# Patient Record
Sex: Female | Born: 1970 | Hispanic: Yes | Marital: Single | Smoking: Never smoker
Health system: Southern US, Community
[De-identification: ages and names within clinical notes are randomized; demographics above are authoritative.]

## PROBLEM LIST (undated history)

## (undated) DIAGNOSIS — I10 Essential (primary) hypertension: Secondary | ICD-10-CM

## (undated) HISTORY — PX: ABDOMINAL HYSTERECTOMY: SHX81

---

## 2019-06-14 ENCOUNTER — Telehealth: Payer: Self-pay | Admitting: Pediatric Intensive Care

## 2019-06-14 NOTE — Telephone Encounter (Signed)
Interpretation via [place]- Client states recent history of feeling flush, especially when she is anxious. Client states history of hypertension but has never been diagnosed or treated with medication. Client has been working toward [employer] but has not completed as of yet. She would like to be connected to primary care. Lisette Abu RN BSN CNP [phone]

## 2019-06-15 ENCOUNTER — Telehealth: Payer: Self-pay

## 2019-06-15 NOTE — Telephone Encounter (Signed)
Call received from [facility], RN/CN requesting an appointment for patient to establish care at [facility]. Informed her that an appointment has been scheduled for 06/23/2019 @ 1530 @ [place]. The patient will be called to confirm if this will be in person or televisit /WebEx.  Message sent to [facility] front office notifying them of the appointment.  As per [place] [facility] [employer] is working with the patient and will notify her of the appointment

## 2019-06-23 ENCOUNTER — Ambulatory Visit: Payer: Self-pay | Admitting: Primary Care

## 2021-09-04 ENCOUNTER — Encounter (HOSPITAL_COMMUNITY): Payer: Self-pay

## 2021-09-04 ENCOUNTER — Other Ambulatory Visit: Payer: Self-pay

## 2021-09-04 ENCOUNTER — Ambulatory Visit (HOSPITAL_COMMUNITY)
Admission: EM | Admit: 2021-09-04 | Discharge: 2021-09-04 | Disposition: A | Discharge status: Home / Self Care | Payer: Self-pay | Attending: Emergency Medicine | Admitting: Emergency Medicine

## 2021-09-04 DIAGNOSIS — Z20822 Contact with and (suspected) exposure to covid-19: Secondary | ICD-10-CM | POA: Insufficient documentation

## 2021-09-04 DIAGNOSIS — R300 Dysuria: Secondary | ICD-10-CM

## 2021-09-04 DIAGNOSIS — B349 Viral infection, unspecified: Secondary | ICD-10-CM

## 2021-09-04 LAB — POCT URINALYSIS DIPSTICK, ED / UC
Bilirubin Urine: NEGATIVE
Glucose, UA: NEGATIVE mg/dL
Ketones, ur: NEGATIVE mg/dL
Nitrite: NEGATIVE
Protein, ur: NEGATIVE mg/dL
Specific Gravity, Urine: 1.025 (ref 1.005–1.030)
Urobilinogen, UA: 1 mg/dL (ref 0.0–1.0)
pH: 7 (ref 5.0–8.0)

## 2021-09-04 NOTE — Discharge Instructions (Addendum)
Quarantine until you have COVID results/per CDC guidelines. Please get established with PCP of your choice for regular check ups, annual physical,etc.   Your urine is being sent for culture. No abx indicated at present. Increase fluid intake, may alternate tylenol/ibuprofen as label directed.

## 2021-09-04 NOTE — ED Triage Notes (Signed)
Pt presents with headache, non productive cough, sore throat, and chills X 5 days.

## 2021-09-04 NOTE — ED Provider Notes (Signed)
[facility]    CSN: [id number] Arrival date & time: 09/04/21  1433      History   Chief Complaint Chief Complaint  Patient presents with   URI   Headache     HPI Patricia Mccann is a 50 y.o. female.   50 year old female pt, Patricia Mccann, presents to UC with chief complaint of HA,nonproductive cough, sore throat, chills x 5 days , also c/o dysuria, per interpreter # [phone]. Advised via interpreter would perform UA and Covid test. Pt states she has only tried claritin and rest for symptoms. No known illness exposure, works at [employer]  The history is provided by the patient. No language interpreter was used.   History reviewed. No pertinent past medical history.  Patient Active Problem List   Diagnosis Date Noted   Nonspecific syndrome suggestive of viral illness 09/04/2021   Dysuria 09/04/2021     History reviewed. No pertinent surgical history.  OB History   No obstetric history on file.      Home Medications    Prior to Admission medications   Not on File    Family History Family History  Family history unknown: Yes    Social History Social History   Tobacco Use   Smoking status: Never   Smokeless tobacco: Never     Allergies   Patient has no known allergies.   Review of Systems Review of Systems  Constitutional:  Positive for chills.  HENT:  Positive for congestion and sore throat.   Respiratory:  Positive for cough.   Genitourinary:  Positive for dysuria.  Neurological:  Positive for headaches.  All other systems reviewed and are negative.   Physical Exam Triage Vital Signs ED Triage Vitals  Enc Vitals Group     BP      Pulse      Resp      Temp      Temp src      SpO2      Weight      Height      Head Circumference      Peak Flow      Pain Score      Pain Loc      Pain Edu?      Excl. in GC?    No data found.  Updated Vital Signs BP 120/75 (BP Location: Left Arm)   Pulse 69   Temp 98.5  F (36.9 C) (Oral)   Resp 18   SpO2 97%   Visual Acuity Right Eye Distance:   Left Eye Distance:   Bilateral Distance:    Right Eye Near:   Left Eye Near:    Bilateral Near:     Physical Exam Vitals and nursing note reviewed.  Constitutional:      General: She is not in acute distress.    Appearance: She is well-developed.  HENT:     Head: Normocephalic.     Right Ear: Tympanic membrane is retracted.     Left Ear: Tympanic membrane is retracted.     Nose: Congestion present.     Mouth/Throat:     Lips: Pink.     Mouth: Mucous membranes are moist.     Pharynx: Oropharynx is clear.  Eyes:     General: Lids are normal.     Conjunctiva/sclera: Conjunctivae normal.     Pupils: Pupils are equal, round, and reactive to light.  Neck:     Trachea: No tracheal deviation.  Cardiovascular:     Rate and Rhythm: Regular rhythm.     Pulses: Normal pulses.     Heart sounds: Normal heart sounds. No murmur heard. Pulmonary:     Effort: Pulmonary effort is normal.     Breath sounds: Normal breath sounds.  Abdominal:     General: Bowel sounds are normal.     Palpations: Abdomen is soft.     Tenderness: There is no abdominal tenderness.  Musculoskeletal:        General: Normal range of motion.     Cervical back: Normal range of motion.  Lymphadenopathy:     Cervical: No cervical adenopathy.  Skin:    General: Skin is warm and dry.     Findings: No rash.  Neurological:     General: No focal deficit present.     Mental Status: She is alert and oriented to person, place, and time.     GCS: GCS eye subscore is 4. GCS verbal subscore is 5. GCS motor subscore is 6.  Psychiatric:        Attention and Perception: Attention normal.        Mood and Affect: Mood normal.        Speech: Speech normal.        Behavior: Behavior normal. Behavior is cooperative.     UC Treatments / Results  Labs (all labs ordered are listed, but only abnormal results are displayed) Labs Reviewed  POCT  URINALYSIS DIPSTICK, ED / UC - Abnormal; Notable for the following components:      Result Value   Hgb urine dipstick TRACE (*)    Leukocytes,Ua SMALL (*)    All other components within normal limits  SARS CORONAVIRUS 2 (TAT 6-24 HRS)  URINE CULTURE    EKG   Radiology No results found.  Procedures Procedures (including critical care time)  Medications Ordered in UC Medications - No data to display  Initial Impression / Assessment and Plan / UC Course  I have reviewed the triage vital signs and the nursing notes.  Pertinent labs & imaging results that were available during my care of the patient were reviewed by me and considered in my medical decision making (see chart for details).   Ddx: COVID, URI, UTI, allergies Final Clinical Impressions(s) / UC Diagnoses   Final diagnoses:  Nonspecific syndrome suggestive of viral illness  Dysuria     Discharge Instructions      Quarantine until you have COVID results/per CDC guidelines. Please get established with PCP of your choice for regular check ups, annual physical,etc.   Your urine is being sent for culture. No abx indicated at present. Increase fluid intake, may alternate tylenol/ibuprofen as label directed.      ED Prescriptions   None    PDMP not reviewed this encounter.   Clancy Gourd, NP 09/04/21 1902

## 2021-09-05 LAB — URINE CULTURE
Culture: NO GROWTH
Special Requests: NORMAL

## 2021-09-05 LAB — SARS CORONAVIRUS 2 (TAT 6-24 HRS): SARS Coronavirus 2: NEGATIVE

## 2021-10-10 ENCOUNTER — Other Ambulatory Visit: Payer: Self-pay

## 2021-10-10 ENCOUNTER — Ambulatory Visit (HOSPITAL_COMMUNITY)
Admission: EM | Admit: 2021-10-10 | Discharge: 2021-10-10 | Disposition: A | Discharge status: Home / Self Care | Payer: Self-pay | Attending: Emergency Medicine | Admitting: Emergency Medicine

## 2021-10-10 ENCOUNTER — Encounter (HOSPITAL_COMMUNITY): Payer: Self-pay | Admitting: *Deleted

## 2021-10-10 ENCOUNTER — Ambulatory Visit (INDEPENDENT_AMBULATORY_CARE_PROVIDER_SITE_OTHER): Payer: Self-pay

## 2021-10-10 DIAGNOSIS — R0789 Other chest pain: Secondary | ICD-10-CM

## 2021-10-10 DIAGNOSIS — R079 Chest pain, unspecified: Secondary | ICD-10-CM

## 2021-10-10 MED ORDER — NAPROXEN 500 MG PO TABS: 500.0000 mg | ORAL_TABLET | Freq: Two times a day (BID) | ORAL | 0 refills | Status: DC

## 2021-10-10 NOTE — Discharge Instructions (Addendum)
Your EKG and chest x-ray were normal.  I suspect that this is musculoskeletal chest pain.  I am giving you Naprosyn to take twice a day.  Take it with 1000 mg of Tylenol.  This should help with your pain.  Below is a list of primary care practices who are taking new patients for you to follow-up with.  [facility] internal medicine clinic Ground Floor - [facility], [address] [place], [place], [place] [address] [phone]  [facility] Care at [facility] [address] [place], [place] [address] [phone]  [facility] [address] [place], [place] [address] [phone]  [facility] [phone] [address] [place] [address]  [facility]: [address] [place] [address]  [phone]  [facility]: [address] [place] [address]  [phone]  [facility] : [address] [place] [address] [phone]  [facility] Primary Care: [address] [place] [address] [phone]  [facility]: [address] [place] [address] [phone]  Dr. Oneal Grout [address] [facility] [place] [address]  [phone]  Go to www.goodrx.com  or www.costplusdrugs.com to look up your medications. This will give you a list of where you can find your prescriptions at the most affordable prices. Or ask the pharmacist what the cash price is, or if they have any other discount programs available to help make your medication more affordable. This can be less expensive than what you would pay with insurance.

## 2021-10-10 NOTE — ED Triage Notes (Signed)
Pt reports Lt sided CP today. Blurred vision started one week ago. Pt also reports a nose bleed one week ago.

## 2021-10-10 NOTE — ED Provider Notes (Signed)
HPI  SUBJECTIVE:  Patricia Mccann is a 50 y.o. female who presents with 5 days of seconds long stabbing left-sided chest pain.  She states that she is having 2-3 episodes per day.  She reports occasional diaphoresis and palpitations with this chest pain.  No nausea, shortness of breath, trauma to the chest, coughing, wheezing.  She works 12-hour shifts at [employer] and does a lot of heavy lifting with her arms.  No other change in physical activity.  There is no exertional or positional component to it.  It is worse with deep inspiration.  She has not tried anything for this.  No alleviating factors.  No calf pain, swelling, hemoptysis, surgery in the past 4 weeks, exogenous estrogen, recent immobilization.  She has had identical chest pain like this before when she lost her daughter.  She got an unknown injection from her PMD.  She has never had a cardiac or ER evaluation for this chest pain.  Past medical history negative for diabetes, hypertension, hypercholesterolemia, MI, coronary disease, PVD/PAD, CVA/TIA, PE/DVT, cancer, smoking.  Family history significant for sister with MI at age 20.  PMD: None.   History reviewed. No pertinent past medical history.  History reviewed. No pertinent surgical history.  Family History  Family history unknown: Yes    Social History   Tobacco Use   Smoking status: Never   Smokeless tobacco: Never    No current facility-administered medications for this encounter.  Current Outpatient Medications:    naproxen (NAPROSYN) 500 MG tablet, Take 1 tablet (500 mg total) by mouth 2 (two) times daily., Disp: 20 tablet, Rfl: 0  No Known Allergies   ROS  As noted in HPI.   Physical Exam  BP (!) 143/106   Pulse 72   Temp 97.7 F (36.5 C) (Oral)   Resp 20   SpO2 99%   Constitutional: Well developed, well nourished, no acute distress Eyes:  EOMI, conjunctiva normal bilaterally HENT: Normocephalic, atraumatic,mucus membranes moist Respiratory:  Normal inspiratory effort, lungs clear bilaterally. Cardiovascular: Normal rate, regular rhythm, no murmurs rubs or gallops.  Positive reproducible left-sided chest wall tenderness GI: nondistended skin: No rash, skin intact Musculoskeletal: Calves symmetric, nontender, no edema Neurologic: Alert & oriented x 3, no focal neuro deficits Psychiatric: Speech and behavior appropriate   ED Course   Medications - No data to display  Orders Placed This Encounter  Procedures   DG Chest 2 View    Standing Status:   Standing    Number of Occurrences:   1    Order Specific Question:   Reason for Exam (SYMPTOM  OR DIAGNOSIS REQUIRED)    Answer:   L sided CP   EKG 12-Lead    Standing Status:   Standing    Number of Occurrences:   1   ED EKG    Standing Status:   Standing    Number of Occurrences:   1    Order Specific Question:   Reason for Exam    Answer:   Chest Pain    Order Specific Question:   Release to patient    Answer:   Immediate    No results found for this or any previous visit (from the past 24 hour(s)). DG Chest 2 View  Result Date: 10/10/2021 CLINICAL DATA:  Left chest pain EXAM: CHEST - 2 VIEW COMPARISON:  None. FINDINGS: The heart size and mediastinal contours are within normal limits. Both lungs are clear. The visualized skeletal structures are unremarkable. IMPRESSION:  No active cardiopulmonary disease. Electronically Signed   By: Helyn Numbers M.D.   On: 10/10/2021 21:09    ED Clinical Impression  1. Chest pain, musculoskeletal      ED Assessment/Plan  EKG: Normal sinus rhythm, rate 84.  Normal axis, normal vocals.  No hypertrophy.  No ST-T wave changes consistent with ischemia.  No previous EKG for comparison.  Patient was asymptomatic while EKG was obtained.  This seems very musculoskeletal as it is reproducible, she does a lot of heavy lifting at work.  Wells score for PE 0.  Her HEARt score is 3: 1 point for history one-point for age, one-point for family  history of MI before 40.  She is at low risk for major cardiac adverse event within the next 30 days.  Discussed with patient that we would be able to do an x-ray here, but not be able to complete a cardiac work-up.  Discussed with her that I be happy to transfer to the emergency department to make sure that this is not her heart, however, she has opted for chest x-ray here, trial of NSAIDs, and close outpatient follow-up.  Strict ER return precautions given.  Will provide primary care list and order assistance of finding a PMD.   Reviewed imaging independently.  Normal chest x-ray.  See radiology report for full details.  will try treating as a musculoskeletal chest pain for now.  Home with Naprosyn 500 mg p.o. twice daily for 5 days Using the language line, discussed EKG, imaging, MDM, treatment plan, and plan for follow-up with patient. Discussed sn/sx that should prompt return to the ED. patient agrees with plan.   Spent 50 minutes with patient obtaining H&P, explaining treatment plan, plan for follow-up and medical decision making.  Meds ordered this encounter  Medications   naproxen (NAPROSYN) 500 MG tablet    Sig: Take 1 tablet (500 mg total) by mouth 2 (two) times daily.    Dispense:  20 tablet    Refill:  0      *This clinic note was created using Scientist, clinical (histocompatibility and immunogenetics). Therefore, there may be occasional mistakes despite careful proofreading.  ?    Domenick Gong, MD 10/10/21 2131

## 2022-03-12 ENCOUNTER — Encounter: Payer: Self-pay | Admitting: Family Medicine

## 2022-03-12 ENCOUNTER — Ambulatory Visit: Payer: Self-pay | Attending: Family Medicine | Admitting: Family Medicine

## 2022-03-12 ENCOUNTER — Other Ambulatory Visit: Payer: Self-pay

## 2022-03-12 VITALS — BP 134/81 | HR 72 | Ht 60.0 in | Wt 120.8 lb

## 2022-03-12 DIAGNOSIS — Z13228 Encounter for screening for other metabolic disorders: Secondary | ICD-10-CM

## 2022-03-12 DIAGNOSIS — G8929 Other chronic pain: Secondary | ICD-10-CM

## 2022-03-12 DIAGNOSIS — M25561 Pain in right knee: Secondary | ICD-10-CM

## 2022-03-12 DIAGNOSIS — Z1159 Encounter for screening for other viral diseases: Secondary | ICD-10-CM

## 2022-03-12 DIAGNOSIS — R634 Abnormal weight loss: Secondary | ICD-10-CM

## 2022-03-12 DIAGNOSIS — R630 Anorexia: Secondary | ICD-10-CM

## 2022-03-12 MED ORDER — MIRTAZAPINE 15 MG PO TBDP: 15.0000 mg | ORAL_TABLET | Freq: Every day | ORAL | 1 refills | Status: DC

## 2022-03-12 MED ORDER — MELOXICAM 7.5 MG PO TABS: 7.5000 mg | ORAL_TABLET | Freq: Every day | ORAL | 1 refills | Status: DC

## 2022-03-12 NOTE — Progress Notes (Signed)
? ?Subjective:  ?Patient ID: Patricia Mccann, female    DOB: 10-Jan-1971  Age: 51 y.o. MRN: [id number] ? ?CC: New Patient (Initial Visit) ? ? ?HPI ?Patricia Mccann is a 51 y.o. year old female who presents today to establish care. ? ?Interval History: ?She would like to gain some weight as she currently weighs 120lbs and would like to weigh more. In the past she weighed 135 lbs and she underwent some form of trauma in the past after which she lost weight. When she takes vitamins she notices an improved appetite but without that she has a poor appetite. ?Denies being depressed and denies presence of insomnia. ? ?She has not had periods since the age of 41; she states she had a c-section but is not sure what kind of surgery she had. ? ?Complains of right knee pain in the posterior medial aspect of her knee and does feel a bulge in that aspect when she has been standing for prolonged periods of time ?No past medical history on file. ? ?Past Surgical History:  ?Procedure Laterality Date  ? CESAREAN SECTION    ? ? ?Family History  ?Family history unknown: Yes  ? ? ?Social History  ? ?Socioeconomic History  ? Marital status: Single  ?  Spouse name: Not on file  ? Number of children: Not on file  ? Years of education: Not on file  ? Highest education level: Not on file  ?Occupational History  ? Not on file  ?Tobacco Use  ? Smoking status: Never  ? Smokeless tobacco: Never  ?Substance and Sexual Activity  ? Alcohol use: Never  ? Drug use: Never  ? Sexual activity: Yes  ?Other Topics Concern  ? Not on file  ?Social History Narrative  ? Not on file  ? ?Social Determinants of Health  ? ?Financial Resource Strain: Not on file  ?Food Insecurity: Not on file  ?Transportation Needs: Not on file  ?Physical Activity: Not on file  ?Stress: Not on file  ?Social Connections: Not on file  ? ? ?No Known Allergies ? ?Outpatient Medications Prior to Visit  ?Medication Sig Dispense Refill  ? naproxen (NAPROSYN) 500 MG tablet Take 1  tablet (500 mg total) by mouth 2 (two) times daily. 20 tablet 0  ? ?No facility-administered medications prior to visit.  ? ? ? ?ROS ?Review of Systems  ?Constitutional:  Negative for activity change, appetite change and fatigue.  ?HENT:  Negative for congestion, sinus pressure and sore throat.   ?Eyes:  Negative for visual disturbance.  ?Respiratory:  Negative for cough, chest tightness, shortness of breath and wheezing.   ?Cardiovascular:  Negative for chest pain and palpitations.  ?Gastrointestinal:  Negative for abdominal distention, abdominal pain and constipation.  ?Endocrine: Negative for polydipsia.  ?Genitourinary:  Negative for dysuria and frequency.  ?Musculoskeletal:   ?     See HPI  ?Skin:  Negative for rash.  ?Neurological:  Negative for tremors, light-headedness and numbness.  ?Hematological:  Does not bruise/bleed easily.  ?Psychiatric/Behavioral:  Negative for agitation and behavioral problems.   ? ?Objective:  ?BP 134/81   Pulse 72   Ht 5' (1.524 m)   Wt 120 lb 12.8 oz (54.8 kg)   SpO2 98%   BMI 23.59 kg/m?  ? ?BP/Weight 03/12/2022 10/10/2021 09/04/2021  ?Systolic BP 314 970 263  ?Diastolic BP 81 785 75  ?Wt. (Lbs) 120.8 - -  ?BMI 23.59 - -  ? ? ? ? ?Physical Exam ?Constitutional:   ?  Appearance: She is well-developed.  ?Cardiovascular:  ?   Rate and Rhythm: Normal rate.  ?   Heart sounds: Normal heart sounds. No murmur heard. ?Pulmonary:  ?   Effort: Pulmonary effort is normal.  ?   Breath sounds: Normal breath sounds. No wheezing or rales.  ?Chest:  ?   Chest wall: No tenderness.  ?Abdominal:  ?   General: Bowel sounds are normal. There is no distension.  ?   Palpations: Abdomen is soft. There is no mass.  ?   Tenderness: There is no abdominal tenderness.  ?Musculoskeletal:     ?   General: Normal range of motion.  ?   Right lower leg: No edema.  ?   Left lower leg: No edema.  ?   Comments: No knee edema ?Slight tenderness on palpation of posterior medial aspect of right knee  ?Neurological:   ?   Mental Status: She is alert and oriented to person, place, and time.  ?Psychiatric:     ?   Mood and Affect: Mood normal.  ? ? ? ?Assessment & Plan:  ?1. Weight loss ?We will need to evaluate for possible underlying cause ?Commence with blood work and will perform malignancy screen at her next visit ?- CBC with Differential/Platelet; Future ?- T4, free; Future ?- TSH; Future ?- VITAMIN D 25 Hydroxy (Vit-D Deficiency, Fractures); Future ?- mirtazapine (REMERON SOL-TAB) 15 MG disintegrating tablet; Take 1 tablet (15 mg total) by mouth at bedtime.  Dispense: 30 tablet; Refill: 1 ? ?2. Appetite loss ?See #1 above ?Initiate Remeron ?- mirtazapine (REMERON SOL-TAB) 15 MG disintegrating tablet; Take 1 tablet (15 mg total) by mouth at bedtime.  Dispense: 30 tablet; Refill: 1 ? ?3. Screening for metabolic disorder ?- Hemoglobin A1c; Future ?- LP+Non-HDL Cholesterol; Future ?- CMP14+EGFR; Future ? ?4. Screening for viral disease ?- HCV Ab w Reflex to Quant PCR; Future ?- HIV Antibody (routine testing w rflx); Future ? ?5. Chronic pain of right knee ?Prolonged standing worsens her symptoms ?Possibly underlying osteoarthritis ?Naproxen has been ineffective so I will switch to meloxicam ?- meloxicam (MOBIC) 7.5 MG tablet; Take 1 tablet (7.5 mg total) by mouth daily.  Dispense: 30 tablet; Refill: 1 ? ? ?Health Care Maintenance: We will address at next visit ?Meds ordered this encounter  ?Medications  ? meloxicam (MOBIC) 7.5 MG tablet  ?  Sig: Take 1 tablet (7.5 mg total) by mouth daily.  ?  Dispense:  30 tablet  ?  Refill:  1  ? mirtazapine (REMERON SOL-TAB) 15 MG disintegrating tablet  ?  Sig: Take 1 tablet (15 mg total) by mouth at bedtime.  ?  Dispense:  30 tablet  ?  Refill:  1  ? ? ?Follow-up: Return for Pap smear, Preventive Health Exam.  ? ? ? ? ? ?Charlott Rakes, MD, FAAFP. ?[place] ?[place], [place] ?[phone]   ?03/12/2022, 2:39 PM ?

## 2022-03-12 NOTE — Patient Instructions (Signed)
Prevenci?n de las consecuencias de las conductas poco saludables para bajar de peso en los adultos ?Preventing Consequences of Unhealthy Weight Loss Behaviors, Adult ?Lograr y Pharmacologist un peso saludable es importante para su salud general. El peso saludable var?a de Neomia Dear persona a [place]. Es natural querer Publishing copy de peso enseguida, utilizando el m?todo que parezca m?s r?pido. Sin embargo, Publishing copy de peso de [place] saludable no es un proceso r?pido. En cambio, debes intentar bajar de peso de [place] lenta y constante mediante peque?os cambios y estableciendo objetivos realistas. ??C?mo pueden afectarme las conductas de p?rdida de peso poco saludables? ?Recurrir a Doctor, general practice en un intento por bajar de peso puede causar lo siguiente: ?Cansancio (fatiga), frecuencia card?aca baja y presi?n arterial baja. ?Desequilibrios en el organismo. Estos desequilibrios pueden ser en: ?Electrolitos. Estos son sales y Charity fundraiser. ?Productos qu?micos. Estos son necesarios para el funcionamiento adecuado del cuerpo. ?L?quidos corporales. La p?rdida de l?quidos puede causar deshidrataci?n. ?Da?o en los ?rganos o insuficiencia org?nica, que afecta especialmente a los ri?ones. ?Huesos fr?giles que se fracturan con facilidad. ?[place] o [employer] con amigos y familiares. ?Problemas emocionales, que incluyen depresi?n y ansiedad. ?[employer] poco saludables para bajar de peso por modificaciones en el estilo de vida mejorar? su salud general. Adem?s, mantener un peso saludable reduce el riesgo de sufrir ciertas afecciones, por ejemplo: ?Obesidad. ?Enfermedad card?aca, colesterol alto y presi?n arterial alta. ?Diabetes tipo 2. ?Trastornos del sue?o y de la respiraci?n. ?Accidente cerebrovascular. ?Artrosis. Esta afecta a las articulaciones. ?Osteoporosis. Esta afecta a los huesos. ?Algunos tipos de c?ncer. ??Qu? puede aumentar el riesgo? ?Ciertas visiones o sentimientos sobre uno  mismo y ciertos h?bitos pueden aumentar el riesgo de conductas poco saludables para bajar de [place]. Esto incluye lo siguiente: ?Tener depresi?n y Warehouse manager sobrepeso en la Estate manager/land agent. ?Intentar perder peso siendo un ni?o o un adolescente. ?Consumir alcohol, drogas o productos de tabaco. ??Qu? medidas puedo tomar para prevenir estas conductas? ?Puede hacer ciertos cambios en su estilo de vida que lo ayudar?n a bajar de peso de [employer]. Entre [employer], se incluyen comer alimentos nutritivos y Radio producer ejercicio con regularidad. ?Nutrici?n ? ?Coma alimentos saludables y variados, como frutas y verduras, cereales integrales, prote?nas magras y productos l?cteos bajos en grasa. ?Camera operator en lugar de bebidas azucaradas, como jugos, refrescos o bebidas deportivas. ?Beba suficiente l?quido como para mantener la orina de color amarillo p?lido. ?Planifique comidas saludables y equilibradas. Trabaje con un nutricionista para elaborar un plan de comidas saludables que sea adecuado para usted. ?Limite los siguientes productos: ?Alimentos con alto contenido de grasa, sal (sodio) o az?car. Entre estos se [employer], las donas, la pizza y la comida r?pida. ?Alimentos fritos o muy procesados. ?Estilo de vida ?Evite los h?bitos alimentarios poco saludables, como: ?Seguir una dieta que proh?ba clases enteras de alimentos. Esta puede ser Neomia Dear dieta popular que promete resultados espectaculares en [place]. ?Saltearse comidas para ahorrar calor?as. ?No comer nada durante per?odos prolongados (ayunar). ?Limitar las calor?as muy por debajo de la cantidad que necesita para bajar de peso o Pharmacologist un peso saludable. ?Tomar laxantes para defecar con mayor frecuencia. ?Tomar medicamentos para hacer que el cuerpo elimine el exceso de l?quido (diur?ticos). ?Consumir una cantidad excesiva de alimentos y luego provocarte el v?mito. Esto se conoce como atracones y [place]. ?No consuma ning?n producto que contenga nicotina o tabaco.  Estos productos incluyen cigarrillos, tabaco para mascar y aparatos de vapeo, como los cigarrillos electr?nicos. Si necesita ayuda  para dejar de consumir estos productos, consulte al m?dico. ?Consumo de alcohol ?No beba alcohol si: ?Su m?dico le indica no hacerlo. ?Est? embarazada, puede estar embarazada o est? tratando de quedar embarazada. ?Si bebe alcohol: ?Limite la cantidad que bebe a lo siguiente: ?De 0 a 1 medida por d?a para las mujeres. ?De 0 a 2 medidas por d?a para los hombres. ?Sepa cu?nta cantidad de alcohol hay en las bebidas que toma. En los [address], una medida equivale a una botella de cerveza de 12 oz (355 ml), un vaso de vino de 5 oz (148 ml) o un vaso de una bebida alcoh?lica de alta graduaci?n de 1? oz (44 ml). ?Actividad ? ?Evite practicar ejercicio extremadamente intenso de manera compulsiva. ?Trabaje con un nutricionista para elaborar un programa de ejercicios que sea saludable. ?Incluya diferentes tipos de ejercicios en el programa, por ejemplo, ejercicios de fuerza, aer?bicos y de flexibilidad. ?Para [employer], haga por lo menos 150 minutos de ejercicio de intensidad moderada por semana. El ejercicio de intensidad moderada podr?a incluir caminar en?rgicamente o andar en bicicleta. ?Para bajar de peso de [place] saludable, haga 60 minutos de ejercicio de intensidad moderada por d?a. ?Encuentre maneras de reducir el estr?s, por ejemplo, hacer ejercicio o meditar con regularidad. ?Encuentre un pasatiempo u otra actividad que disfrute para distraerse y Facilities manager cuando se sienta estresado o aburrido. ?D?nde obtener apoyo ?Para recibir m?s apoyo, hable con las siguientes personas: ?Su m?dico o nutricionista. Pregunta por grupos de apoyo. ?Un profesional de salud mental. ?Familiares y Personnel officer. ?D?nde obtener m?s informaci?n ?Conozca m?s acerca de c?mo prevenir las complicaciones causadas por conductas poco saludables para bajar de [employer] siguientes sitios: ?Marine scientist for Disease  Control and Prevention (Centros para el Control y la Prevenci?n de [place]): FootballExhibition.com.br ?[employer] of Mental Health (Instituto Nacional de la Salud Mental): http://www.maynard.net/ ?National Eating Disorders Association (Asociaci?n nacional contra los trastornos alimentarios): www.nationaleatingdisorders.org ?Comun?quese con un m?dico si: ?Se siente muy cansado con frecuencia. ?Nota cambios en la piel o en el cabello. ?Se desmaya debido a la deshidrataci?n o porque hizo demasiado ejercicio. ?Tiene dificultades para cambiar sus conductas poco saludables para bajar de peso sin [place]. ?Las conductas poco saludables para bajar de peso afectan su vida cotidiana o sus relaciones. ?Presenta signos o s?ntomas de un trastorno de la [employer]. ?Tiene cambios importantes en el peso en [place]. ?Comer o hacer ejercicio le provoca sentimientos de culpa o verg?enza. ?Resumen ?Recurrir a Arboriculturist saludables para tratar de perder peso puede causar una variedad de problemas f?sicos y emocionales que afectan a la salud general y al Health visitor. ?Se debe intentar bajar de peso poco a poco y de [place] constante eligiendo alimentos saludables, evitando los h?bitos alimentarios poco saludables y practicando actividad f?sica con regularidad. ?Se debe consultar al m?dico en caso de dificultades para cambiar las conductas por uno mismo o ante la sospecha de padecer un trastorno alimentario. ?Esta informaci?n no tiene Theme park manager el consejo del m?dico. Aseg?rese de hacerle al m?dico cualquier pregunta que tenga. ?Document Revised: 08/29/2021 Document Reviewed: 08/29/2021 ?Elsevier Patient Education ? 2022 Elsevier Inc. ? ?

## 2022-04-23 ENCOUNTER — Other Ambulatory Visit: Payer: Self-pay

## 2022-04-23 ENCOUNTER — Ambulatory Visit
Admission: RE | Admit: 2022-04-23 | Discharge: 2022-04-23 | Disposition: A | Discharge status: Home / Self Care | Payer: No Typology Code available for payment source | Source: Ambulatory Visit | Attending: Family Medicine | Admitting: Family Medicine

## 2022-04-23 ENCOUNTER — Ambulatory Visit: Payer: Self-pay | Attending: Family Medicine | Admitting: Family Medicine

## 2022-04-23 ENCOUNTER — Other Ambulatory Visit (HOSPITAL_COMMUNITY)
Admission: RE | Admit: 2022-04-23 | Discharge: 2022-04-23 | Disposition: A | Discharge status: Home / Self Care | Payer: No Typology Code available for payment source | Source: Ambulatory Visit | Attending: Family Medicine | Admitting: Family Medicine

## 2022-04-23 VITALS — BP 144/77 | HR 67 | Ht 60.0 in | Wt 122.0 lb

## 2022-04-23 DIAGNOSIS — G8929 Other chronic pain: Secondary | ICD-10-CM

## 2022-04-23 DIAGNOSIS — Z0001 Encounter for general adult medical examination with abnormal findings: Secondary | ICD-10-CM

## 2022-04-23 DIAGNOSIS — M25561 Pain in right knee: Secondary | ICD-10-CM

## 2022-04-23 DIAGNOSIS — Z1211 Encounter for screening for malignant neoplasm of colon: Secondary | ICD-10-CM

## 2022-04-23 DIAGNOSIS — R630 Anorexia: Secondary | ICD-10-CM

## 2022-04-23 DIAGNOSIS — Z124 Encounter for screening for malignant neoplasm of cervix: Secondary | ICD-10-CM | POA: Insufficient documentation

## 2022-04-23 DIAGNOSIS — R030 Elevated blood-pressure reading, without diagnosis of hypertension: Secondary | ICD-10-CM

## 2022-04-23 DIAGNOSIS — Z13228 Encounter for screening for other metabolic disorders: Secondary | ICD-10-CM

## 2022-04-23 DIAGNOSIS — Z0000 Encounter for general adult medical examination without abnormal findings: Secondary | ICD-10-CM

## 2022-04-23 DIAGNOSIS — Z1231 Encounter for screening mammogram for malignant neoplasm of breast: Secondary | ICD-10-CM

## 2022-04-23 DIAGNOSIS — Z1159 Encounter for screening for other viral diseases: Secondary | ICD-10-CM

## 2022-04-23 DIAGNOSIS — R634 Abnormal weight loss: Secondary | ICD-10-CM

## 2022-04-23 MED ORDER — MIRTAZAPINE 30 MG PO TBDP
30.0000 mg | ORAL_TABLET | Freq: Every day | ORAL | 3 refills | Status: DC
  Filled 2022-04-23: qty 30, 30d supply, fill #0

## 2022-04-23 NOTE — Progress Notes (Signed)
? ?Subjective:  ?Patient ID: Patricia Mccann, female    DOB: 1971-07-08  Age: 51 y.o. MRN: [id number] ? ?CC: Annual Exam ? ? ?HPI ?Patricia Mccann is a 51 y.o. year old female with a history of chronic right knee pain ?She presents for an annual physical exam. ? ?Interval History: ?She is due for Colorectal cancer, breast cancer and cervical cancer screening. ? ?At her last visit she had complained of decreased appetite and chronic weight loss for which she was placed on Mirtazepine and she has gained 2 lbs since the last visit. ? ?She complains of R knee pain which still persists and is uncontrolled on Meloxicam. ? ?No past medical history on file. ? ?Past Surgical History:  ?Procedure Laterality Date  ? CESAREAN SECTION    ? ? ?Family History  ?Family history unknown: Yes  ? ? ?Social History  ? ?Socioeconomic History  ? Marital status: Single  ?  Spouse name: Not on file  ? Number of children: Not on file  ? Years of education: Not on file  ? Highest education level: Not on file  ?Occupational History  ? Not on file  ?Tobacco Use  ? Smoking status: Never  ? Smokeless tobacco: Never  ?Substance and Sexual Activity  ? Alcohol use: Never  ? Drug use: Never  ? Sexual activity: Yes  ?Other Topics Concern  ? Not on file  ?Social History Narrative  ? Not on file  ? ?Social Determinants of Health  ? ?Financial Resource Strain: Not on file  ?Food Insecurity: Not on file  ?Transportation Needs: Not on file  ?Physical Activity: Not on file  ?Stress: Not on file  ?Social Connections: Not on file  ? ? ?No Known Allergies ? ?Outpatient Medications Prior to Visit  ?Medication Sig Dispense Refill  ? meloxicam (MOBIC) 7.5 MG tablet Take 1 tablet (7.5 mg total) by mouth daily. 30 tablet 1  ? mirtazapine (REMERON SOL-TAB) 15 MG disintegrating tablet Take 1 tablet (15 mg total) by mouth at bedtime. 30 tablet 1  ? ?No facility-administered medications prior to visit.  ? ? ? ?ROS ?Review of Systems  ?Constitutional:  Negative  for activity change, appetite change and fatigue.  ?HENT:  Negative for congestion, sinus pressure and sore throat.   ?Eyes:  Negative for visual disturbance.  ?Respiratory:  Negative for cough, chest tightness, shortness of breath and wheezing.   ?Cardiovascular:  Negative for chest pain and palpitations.  ?Gastrointestinal:  Negative for abdominal distention, abdominal pain and constipation.  ?Endocrine: Negative for polydipsia.  ?Genitourinary:  Negative for dysuria and frequency.  ?Musculoskeletal:   ?     See HPI  ?Skin:  Negative for rash.  ?Neurological:  Negative for tremors, light-headedness and numbness.  ?Hematological:  Does not bruise/bleed easily.  ?Psychiatric/Behavioral:  Negative for agitation and behavioral problems.   ? ?Objective:  ?BP (!) 144/77   Pulse 67   Ht 5' (1.524 m)   Wt 122 lb (55.3 kg)   SpO2 97%   BMI 23.83 kg/m?  ? ? ?  04/23/2022  ? 11:29 AM 03/12/2022  ?  2:12 PM 10/10/2021  ?  6:01 PM  ?BP/Weight  ?Systolic BP 944 967 591  ?Diastolic BP 77 81 638  ?Wt. (Lbs) 122 120.8   ?BMI 23.83 kg/m2 23.59 kg/m2   ? ? ? ? ?Physical Exam ?Exam conducted with a chaperone present.  ?Constitutional:   ?   General: She is not in acute distress. ?  Appearance: She is well-developed. She is not diaphoretic.  ?HENT:  ?   Head: Normocephalic.  ?   Right Ear: External ear normal.  ?   Left Ear: External ear normal.  ?   Nose: Nose normal.  ?Eyes:  ?   Conjunctiva/sclera: Conjunctivae normal.  ?   Pupils: Pupils are equal, round, and reactive to light.  ?Neck:  ?   Vascular: No JVD.  ?Cardiovascular:  ?   Rate and Rhythm: Normal rate and regular rhythm.  ?   Heart sounds: Normal heart sounds. No murmur heard. ?  No gallop.  ?Pulmonary:  ?   Effort: Pulmonary effort is normal. No respiratory distress.  ?   Breath sounds: Normal breath sounds. No wheezing or rales.  ?Chest:  ?   Chest wall: No tenderness.  ?Breasts: ?   Right: Normal. No mass, nipple discharge or tenderness.  ?   Left: Normal. No mass,  nipple discharge or tenderness.  ?Abdominal:  ?   General: Bowel sounds are normal. There is no distension.  ?   Palpations: Abdomen is soft. There is no mass.  ?   Tenderness: There is no abdominal tenderness.  ?   Hernia: There is no hernia in the left inguinal area or right inguinal area.  ?Genitourinary: ?   General: Normal vulva.  ?   Pubic Area: No rash.   ?   Labia:     ?   Right: No rash.     ?   Left: No rash.   ?   Vagina: Normal.  ?   Cervix: Normal.  ?   Uterus: Normal.   ?   Adnexa: Right adnexa normal and left adnexa normal.    ?   Right: No tenderness.      ?   Left: No tenderness.    ?Musculoskeletal:     ?   General: No tenderness. Normal range of motion.  ?   Cervical back: Normal range of motion. No tenderness.  ?Lymphadenopathy:  ?   Upper Body:  ?   Right upper body: No supraclavicular or axillary adenopathy.  ?   Left upper body: No supraclavicular or axillary adenopathy.  ?Skin: ?   General: Skin is warm and dry.  ?Neurological:  ?   Mental Status: She is alert and oriented to person, place, and time.  ?   Deep Tendon Reflexes: Reflexes are normal and symmetric.  ? ? ? ?Assessment & Plan:  ?1. Annual physical exam ?Counseled on 150 minutes of exercise per week, healthy eating (including decreased daily intake of saturated fats, cholesterol, added sugars, sodium), STI prevention, routine healthcare maintenance. ? ? ?2. Screening for colon cancer ?- Fecal occult blood, imunochemical ? ?3. Encounter for screening mammogram for malignant neoplasm of breast ?- MM DIGITAL SCREENING BILATERAL; Future ? ?4. Screening for cervical cancer ? ?- Cytology - PAP ? ?5. Weight loss ?Increased dose of mirtazapine ?- mirtazapine (REMERON SOL-TAB) 30 MG disintegrating tablet; Take 1 tablet (30 mg total) by mouth at bedtime.  Dispense: 30 tablet; Refill: 3 ?- VITAMIN D 25 Hydroxy (Vit-D Deficiency, Fractures) ?- TSH ?- T4, free ?- CBC with Differential/Platelet ? ?6. Appetite loss ?- mirtazapine (REMERON SOL-TAB)  30 MG disintegrating tablet; Take 1 tablet (30 mg total) by mouth at bedtime.  Dispense: 30 tablet; Refill: 3 ? ?7. Chronic pain of right knee ?Uncontrolled on meloxicam ?- DG Knee Complete 4 Views Right; Future ? ?8. Elevated blood pressure reading without diagnosis of  hypertension ?No previous history of hypertension ?We will work on lifestyle modifications ? ?9. Screening for viral disease ?- HIV Antibody (routine testing w rflx) ?- HCV Ab w Reflex to Quant PCR ? ?10. Screening for metabolic disorder ?- [id number] ?- LP+Non-HDL Cholesterol ?- Hemoglobin A1c ? ? ? ?Meds ordered this encounter  ?Medications  ? mirtazapine (REMERON SOL-TAB) 30 MG disintegrating tablet  ?  Sig: Take 1 tablet (30 mg total) by mouth at bedtime.  ?  Dispense:  30 tablet  ?  Refill:  3  ?  Dose increase  ? ? ?Follow-up: Return in about 6 months (around 10/23/2022) for Follow-up on right knee pain.  ? ? ? ? ? ?Charlott Rakes, MD, FAAFP. ?[place] ?[place], [place] ?[phone]   ?04/23/2022, 12:12 PM ?

## 2022-04-23 NOTE — Patient Instructions (Signed)

## 2022-04-24 ENCOUNTER — Other Ambulatory Visit: Payer: Self-pay

## 2022-04-24 ENCOUNTER — Other Ambulatory Visit: Payer: Self-pay | Admitting: Family Medicine

## 2022-04-24 DIAGNOSIS — G8929 Other chronic pain: Secondary | ICD-10-CM

## 2022-04-25 ENCOUNTER — Other Ambulatory Visit: Payer: Self-pay

## 2022-04-25 LAB — CYTOLOGY - PAP
Comment: NEGATIVE
Diagnosis: NEGATIVE
High risk HPV: NEGATIVE

## 2022-04-28 LAB — CMP14+EGFR
ALT: 35 IU/L — ABNORMAL HIGH (ref 0–32)
AST: 26 IU/L (ref 0–40)
Albumin/Globulin Ratio: 1.8 (ref 1.2–2.2)
Albumin: 4.4 g/dL (ref 3.8–4.8)
Alkaline Phosphatase: 97 IU/L (ref 44–121)
BUN/Creatinine Ratio: 29 — ABNORMAL HIGH (ref 9–23)
BUN: 18 mg/dL (ref 6–24)
Bilirubin Total: 0.3 mg/dL (ref 0.0–1.2)
CO2: 22 mmol/L (ref 20–29)
Calcium: 9.1 mg/dL (ref 8.7–10.2)
Chloride: 103 mmol/L (ref 96–106)
Creatinine, Ser: 0.62 mg/dL (ref 0.57–1.00)
Globulin, Total: 2.4 g/dL (ref 1.5–4.5)
Glucose: 83 mg/dL (ref 70–99)
Potassium: 4.3 mmol/L (ref 3.5–5.2)
Sodium: 140 mmol/L (ref 134–144)
Total Protein: 6.8 g/dL (ref 6.0–8.5)
eGFR: 108 mL/min/{1.73_m2} (ref >59)

## 2022-04-28 LAB — CBC WITH DIFFERENTIAL/PLATELET
Basophils Absolute: 0.1 10*3/uL (ref 0.0–0.2)
Basos: 1 %
EOS (ABSOLUTE): 0.2 10*3/uL (ref 0.0–0.4)
Eos: 3 %
Hematocrit: 39.7 % (ref 34.0–46.6)
Hemoglobin: 13.7 g/dL (ref 11.1–15.9)
Immature Grans (Abs): 0 10*3/uL (ref 0.0–0.1)
Immature Granulocytes: 0 %
Lymphocytes Absolute: 2.2 10*3/uL (ref 0.7–3.1)
Lymphs: 41 %
MCH: 30.6 pg (ref 26.6–33.0)
MCHC: 34.5 g/dL (ref 31.5–35.7)
MCV: 89 fL (ref 79–97)
Monocytes Absolute: 0.5 10*3/uL (ref 0.1–0.9)
Monocytes: 9 %
Neutrophils Absolute: 2.6 10*3/uL (ref 1.4–7.0)
Neutrophils: 46 %
Platelets: 235 10*3/uL (ref 150–450)
RBC: 4.48 x10E6/uL (ref 3.77–5.28)
RDW: 13.1 % (ref 11.7–15.4)
WBC: 5.5 10*3/uL (ref 3.4–10.8)

## 2022-04-28 LAB — HEMOGLOBIN A1C
Est. average glucose Bld gHb Est-mCnc: 114 mg/dL
Hgb A1c MFr Bld: 5.6 % (ref 4.8–5.6)

## 2022-04-28 LAB — HCV INTERPRETATION

## 2022-04-28 LAB — T4, FREE: Free T4: 1.37 ng/dL (ref 0.82–1.77)

## 2022-04-28 LAB — HIV ANTIBODY (ROUTINE TESTING W REFLEX): HIV Screen 4th Generation wRfx: REACTIVE

## 2022-04-28 LAB — HIV-1/HIV-2 QUALITATIVE RNA
Final Interpretation: NEGATIVE
HIV-1 RNA, Qualitative: NONREACTIVE
HIV-2 RNA, Qualitative: NONREACTIVE

## 2022-04-28 LAB — HIV 1/2 AB DIFFERENTIATION
HIV 1 Ab: NONREACTIVE
HIV 2 Ab: NONREACTIVE
NOTE (HIV CONF MULTIP: NEGATIVE

## 2022-04-28 LAB — LP+NON-HDL CHOLESTEROL
Cholesterol, Total: 134 mg/dL (ref 100–199)
HDL: 56 mg/dL (ref >39)
LDL Chol Calc (NIH): 64 mg/dL (ref 0–99)
Total Non-HDL-Chol (LDL+VLDL): 78 mg/dL (ref 0–129)
Triglycerides: 72 mg/dL (ref 0–149)
VLDL Cholesterol Cal: 14 mg/dL (ref 5–40)

## 2022-04-28 LAB — HCV AB W REFLEX TO QUANT PCR: HCV Ab: NONREACTIVE

## 2022-04-28 LAB — TSH: TSH: 2.64 u[IU]/mL (ref 0.450–4.500)

## 2022-04-28 LAB — VITAMIN D 25 HYDROXY (VIT D DEFICIENCY, FRACTURES): Vit D, 25-Hydroxy: 33.7 ng/mL (ref 30.0–100.0)

## 2022-05-08 ENCOUNTER — Ambulatory Visit: Payer: Self-pay | Admitting: Orthopedic Surgery

## 2022-05-20 ENCOUNTER — Telehealth: Payer: Self-pay

## 2022-05-20 NOTE — Telephone Encounter (Signed)
Patient was called and informed of lab results. 

## 2022-05-20 NOTE — Telephone Encounter (Signed)
Patient sister came in stating that patient Patricia Mccann would like to get a call back regarding her lab results from previous visit with Dr. Alvis Lemmings on 04/23/2022.   Patient # [phone].

## 2022-10-23 ENCOUNTER — Ambulatory Visit: Payer: Self-pay | Admitting: Family Medicine

## 2022-10-30 ENCOUNTER — Ambulatory Visit: Payer: Self-pay | Admitting: Physician Assistant

## 2023-02-18 ENCOUNTER — Emergency Department (HOSPITAL_COMMUNITY)
Admission: EM | Admit: 2023-02-18 | Discharge: 2023-02-18 | Disposition: A | Discharge status: Home / Self Care | Payer: Self-pay | Attending: Emergency Medicine | Admitting: Emergency Medicine

## 2023-02-18 ENCOUNTER — Encounter (HOSPITAL_COMMUNITY): Payer: Self-pay | Admitting: Emergency Medicine

## 2023-02-18 ENCOUNTER — Other Ambulatory Visit: Payer: Self-pay

## 2023-02-18 DIAGNOSIS — S0181XA Laceration without foreign body of other part of head, initial encounter: Secondary | ICD-10-CM | POA: Insufficient documentation

## 2023-02-18 DIAGNOSIS — Y990 Civilian activity done for income or pay: Secondary | ICD-10-CM | POA: Insufficient documentation

## 2023-02-18 DIAGNOSIS — Z23 Encounter for immunization: Secondary | ICD-10-CM | POA: Insufficient documentation

## 2023-02-18 DIAGNOSIS — W2209XA Striking against other stationary object, initial encounter: Secondary | ICD-10-CM | POA: Insufficient documentation

## 2023-02-18 MED ORDER — TETANUS-DIPHTH-ACELL PERTUSSIS 5-2.5-18.5 LF-MCG/0.5 IM SUSY
0.5000 mL | PREFILLED_SYRINGE | Freq: Once | INTRAMUSCULAR | Status: AC
  Administered 2023-02-18: 0.5 mL via INTRAMUSCULAR
  Filled 2023-02-18: qty 0.5

## 2023-02-18 MED ORDER — PENICILLIN V POTASSIUM 500 MG PO TABS: 500.0000 mg | ORAL_TABLET | Freq: Four times a day (QID) | ORAL | 0 refills | Status: AC

## 2023-02-18 MED ORDER — LIDOCAINE-EPINEPHRINE-TETRACAINE (LET) TOPICAL GEL
3.0000 mL | Freq: Once | TOPICAL | Status: AC
  Administered 2023-02-18: 3 mL via TOPICAL
  Filled 2023-02-18: qty 3

## 2023-02-18 NOTE — ED Notes (Signed)
Suture cart at the bedside.

## 2023-02-18 NOTE — ED Provider Notes (Signed)
[facility] Provider Note   CSN: [id number] Arrival date & time: 02/18/23  0019     History  Chief Complaint  Patient presents with   Laceration    Patricia Mccann is a 52 y.o. female.  52 year old female presents with complaint of laceration to just above the right upper lip which occurred at work tonight when she was hit in the mouth by a metal tray. Denies dental injury, bleeding is controlled. Last tetanus unknown. No other injuries.        Home Medications Prior to Admission medications   Medication Sig Start Date End Date Taking? Authorizing Provider  penicillin v potassium (VEETID) 500 MG tablet Take 1 tablet (500 mg total) by mouth 4 (four) times daily for 10 days. 02/18/23 02/28/23 Yes Tacy Learn, PA-C      Allergies    Patient has no known allergies.    Review of Systems   Review of Systems Negative except as per HPI Physical Exam Updated Vital Signs BP (!) 158/93 (BP Location: Right Arm)   Pulse 70   Temp 98.9 F (37.2 C) (Oral)   Resp 20   Ht 5' 2.99" (1.6 m)   Wt 56.7 kg   SpO2 99%   BMI 22.15 kg/m  Physical Exam Vitals and nursing note reviewed.  Constitutional:      General: She is not in acute distress.    Appearance: She is well-developed. She is not diaphoretic.  HENT:     Head: Normocephalic.      Mouth/Throat:     Mouth: Mucous membranes are moist.  Eyes:     Extraocular Movements: Extraocular movements intact.     Pupils: Pupils are equal, round, and reactive to light.  Pulmonary:     Effort: Pulmonary effort is normal.  Musculoskeletal:     Cervical back: Normal range of motion and neck supple.  Skin:    General: Skin is warm and dry.     Findings: No erythema or rash.  Neurological:     Mental Status: She is alert and oriented to person, place, and time.  Psychiatric:        Behavior: Behavior normal.     ED Results / Procedures / Treatments   Labs (all labs  ordered are listed, but only abnormal results are displayed) Labs Reviewed - No data to display  EKG None  Radiology No results found.  Procedures .Marland KitchenLaceration Repair  Date/Time: 02/18/2023 6:26 AM  Performed by: Tacy Learn, PA-C Authorized by: Tacy Learn, PA-C   Consent:    Consent obtained:  Verbal   Risks discussed:  Infection, poor cosmetic result and poor wound healing   Alternatives discussed:  No treatment Universal protocol:    Patient identity confirmed:  Verbally with patient Anesthesia:    Anesthesia method:  Topical application   Topical anesthetic:  LET Laceration details:    Location:  Face   Facial location: Above upper lip.   Length (cm):  2   Depth (mm):  2 Exploration:    Limited defect created (wound extended): no     Hemostasis achieved with:  LET   Wound exploration: wound explored through full range of motion and entire depth of wound visualized   Treatment:    Area cleansed with:  Saline   Amount of cleaning:  Standard   Irrigation solution:  Sterile saline Skin repair:    Repair method:  Tissue adhesive Approximation:  Approximation:  Close Repair type:    Repair type:  Simple Post-procedure details:    Dressing:  Open (no dressing)   Procedure completion:  Tolerated     Medications Ordered in ED Medications  Tdap (BOOSTRIX) injection 0.5 mL (0.5 mLs Intramuscular Given 02/18/23 0502)  lidocaine-EPINEPHrine-tetracaine (LET) topical gel (3 mLs Topical Given 02/18/23 0503)    ED Course/ Medical Decision Making/ A&P                             Medical Decision Making Risk Prescription drug management.   52 year old female presents with laceration above right upper lip area which occurred at work tonight when a metal tray hit her in the mouth.  She denies dental injury, no obvious dental injury.  She does have a small laceration to the inner aspect of the lip, question through and through however on further exam, wound does  not appear to go through to the oral mucosa side of the wound.  Wound was anesthetized with let, thoroughly irrigated with saline and closed with Dermabond.  Discussed proper wound care with patient, placed on antibiotics for potential for through and through wound, tetanus updated.  Recommend recheck with Worker's Comp. provider in 1 to 2 days.        Final Clinical Impression(s) / ED Diagnoses Final diagnoses:  Facial laceration, initial encounter    Rx / DC Orders ED Discharge Orders          Ordered    penicillin v potassium (VEETID) 500 MG tablet  4 times daily        02/18/23 0529              Tacy Learn, PA-C 02/18/23 [id number]    Fatima Blank, MD 02/18/23 1759

## 2023-02-18 NOTE — Discharge Instructions (Signed)
Mantenga la herida limpia y seca. NO aplique ningn ungento antibitico [employer]. Enjuague la boca con Listerine o agua salada despus de cada comida. Tome los antibiticos segn lo recetado. Vuelva a consultar con su proveedor de compensacin laboral en 1 o 2 das.   Keep wound clean and dry. Do NOT apply any antibiotic ointment overtop of your glue. Rinse mouth with Listerine or salt water after every meal. Take antibiotics as prescribed.  Recheck with your workers comp provider in 1-2 days.

## 2023-02-18 NOTE — ED Triage Notes (Addendum)
Pt presents with laceration to rip upper lip after cutting it on a tray at work. Denies head trauma or any other injuries.

## 2024-02-01 IMAGING — DX DG KNEE COMPLETE 4+V*R*
4 series · 4 of 4 positions shown · non-contrast
Comparison: None.

CLINICAL DATA: Chronic posterior knee pain.

EXAM:
RIGHT KNEE - COMPLETE 4+ VIEW

[dg knee complete 4 views right (1 of 4)]
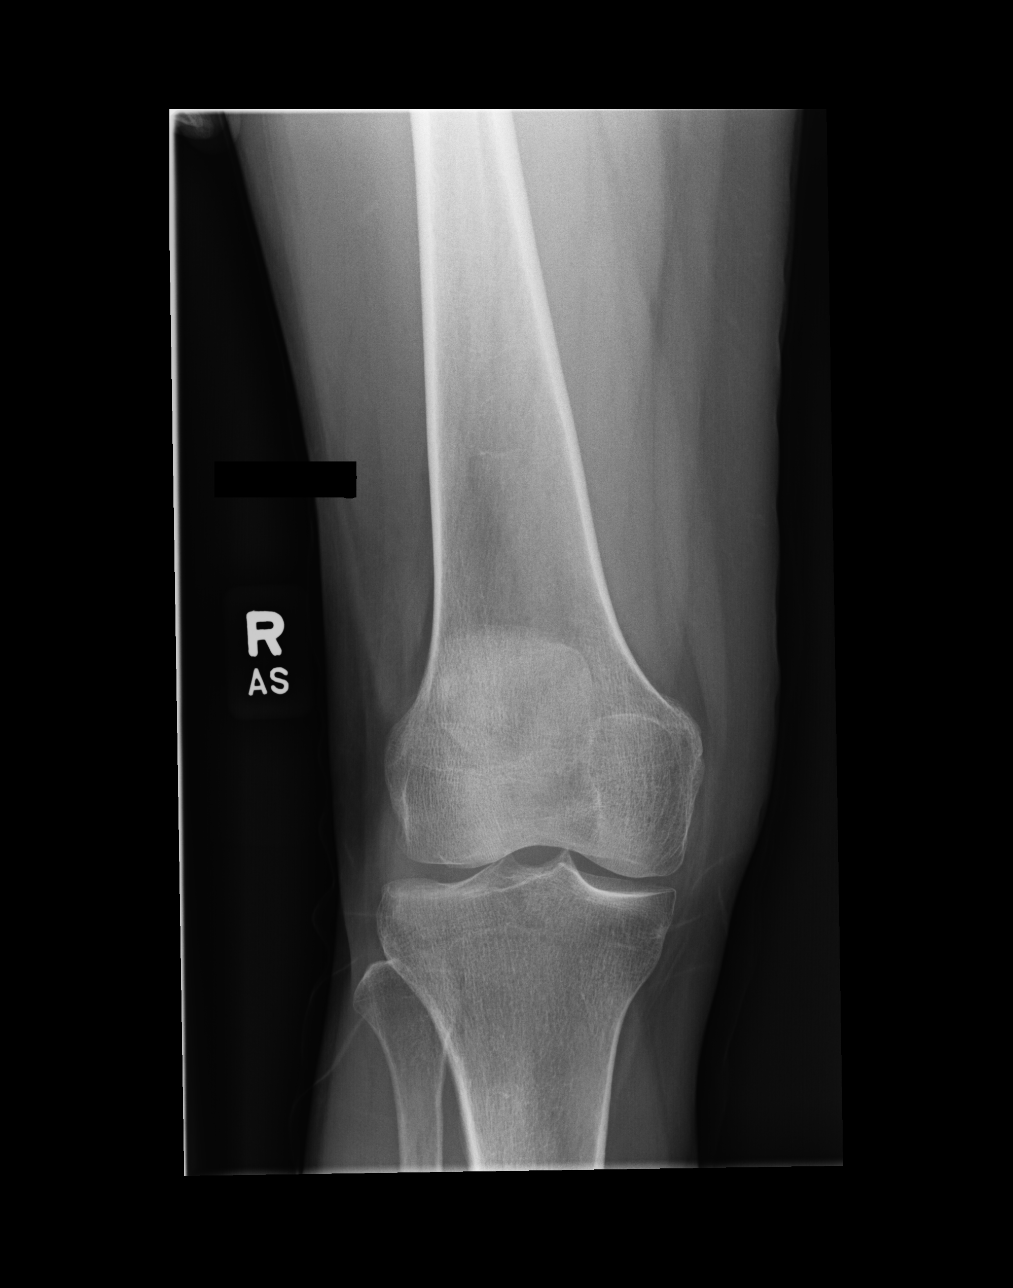

[dg knee complete 4 views right (2 of 4)]
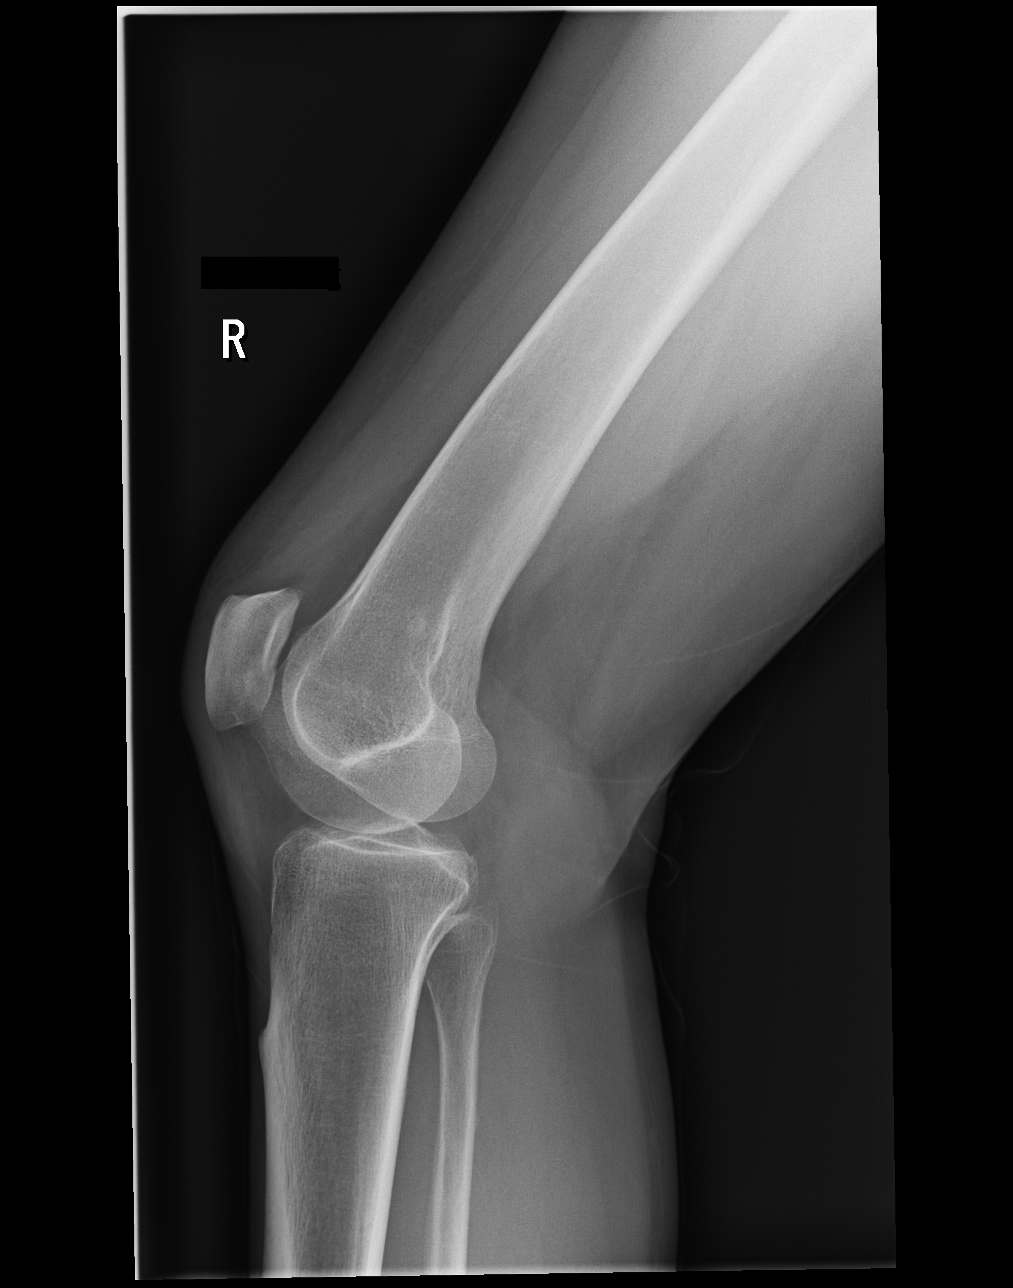

[dg knee complete 4 views right (3 of 4)]
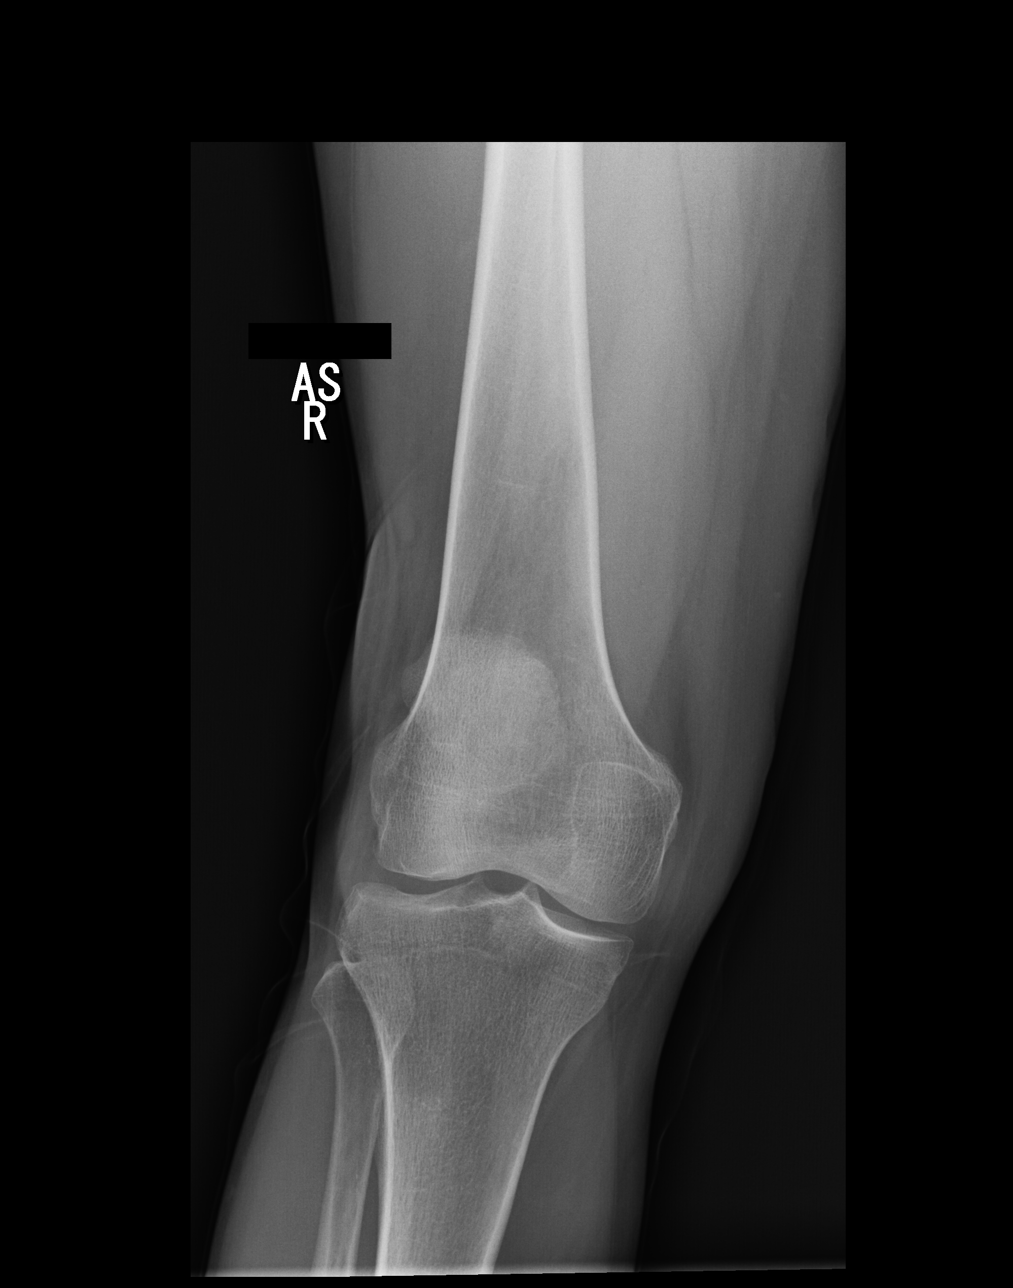

[dg knee complete 4 views right (4 of 4)]
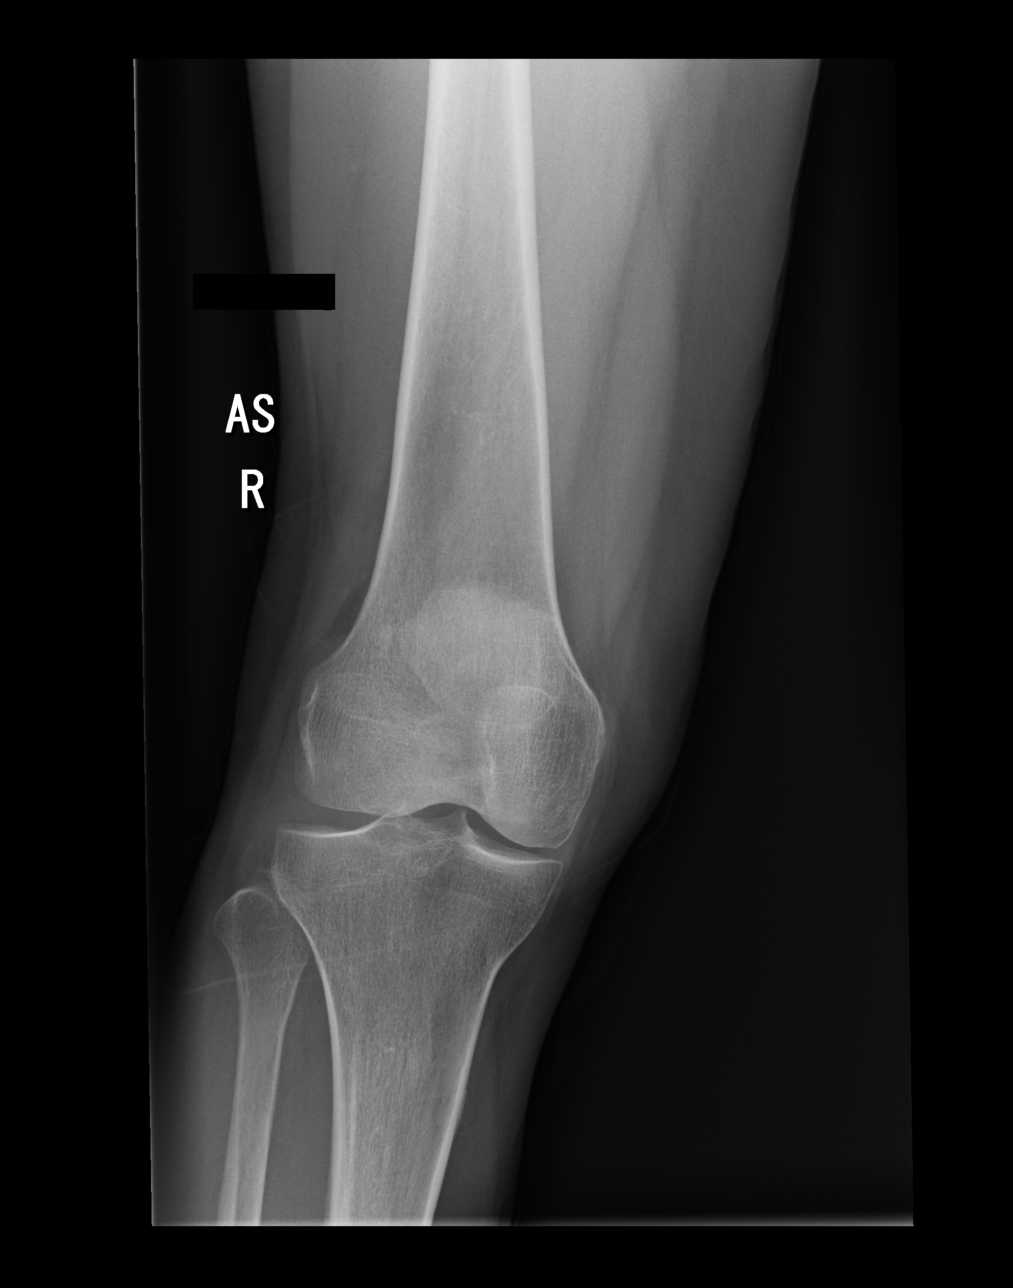

[4 of 4 positions shown; findings below may reference images not displayed]

FINDINGS: No joint effusion. Minimal superior patellar degenerative
osteophytosis. Minimal medial compartment joint space narrowing. No
acute fracture or dislocation.
IMPRESSION: Minimal medial and patellofemoral compartment osteoarthritis. No
acute fracture.

## 2025-01-09 ENCOUNTER — Encounter (HOSPITAL_COMMUNITY): Payer: Self-pay | Admitting: Emergency Medicine

## 2025-01-09 ENCOUNTER — Ambulatory Visit (HOSPITAL_COMMUNITY)
Admission: EM | Admit: 2025-01-09 | Discharge: 2025-01-09 | Disposition: A | Discharge status: Home / Self Care | Payer: Self-pay | Source: Home / Self Care

## 2025-01-09 DIAGNOSIS — I10 Essential (primary) hypertension: Secondary | ICD-10-CM

## 2025-01-09 DIAGNOSIS — H1131 Conjunctival hemorrhage, right eye: Secondary | ICD-10-CM

## 2025-01-09 HISTORY — DX: Essential (primary) hypertension: I10

## 2025-01-09 MED ORDER — AZELASTINE HCL 0.05 % OP SOLN: 1.0000 [drp] | Freq: Two times a day (BID) | OPHTHALMIC | 0 refills | Status: AC

## 2025-01-09 NOTE — Discharge Instructions (Addendum)
" °  1. Subconjunctival hemorrhage of right eye (Primary) - azelastine  (OPTIVAR ) 0.05 % ophthalmic solution; Place 1 drop into the right eye 2 (two) times daily.  Dispense: 6 mL; Refill: 0 - Continue using over-the-counter Visine eyedrops for eye redness and irritation to help resolve subconjunctival hemorrhage.  2. Essential hypertension - Follow-up with primary care provider for further evaluation and management of high blood pressure which is most likely the cause of subconjunctival hemorrhage of the right eye.  -Continue to monitor symptoms for any change in severity if there is any escalation of current symptoms or development of new symptoms follow-up in ER for further evaluation and management. "
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
## 2025-01-31 ENCOUNTER — Ambulatory Visit: Payer: Self-pay | Admitting: Family Medicine

## 2025-02-21 ENCOUNTER — Ambulatory Visit: Payer: Self-pay | Admitting: Family Medicine
